# Patient Record
Sex: Male | Born: 1965 | Race: White | Hispanic: No | Marital: Married | Smoking: Current every day smoker
Health system: Southern US, Community
[De-identification: ages and names within clinical notes are randomized; demographics above are authoritative.]

## PROBLEM LIST (undated history)

## (undated) DIAGNOSIS — G600 Hereditary motor and sensory neuropathy: Secondary | ICD-10-CM

## (undated) DIAGNOSIS — F1011 Alcohol abuse, in remission: Secondary | ICD-10-CM

## (undated) DIAGNOSIS — D334 Benign neoplasm of spinal cord: Secondary | ICD-10-CM

## (undated) DIAGNOSIS — E785 Hyperlipidemia, unspecified: Secondary | ICD-10-CM

## (undated) DIAGNOSIS — F17200 Nicotine dependence, unspecified, uncomplicated: Secondary | ICD-10-CM

## (undated) HISTORY — DX: Hereditary motor and sensory neuropathy: G60.0

## (undated) HISTORY — DX: Benign neoplasm of spinal cord: D33.4

## (undated) HISTORY — PX: TUMOR REMOVAL: SHX12

## (undated) HISTORY — DX: Alcohol abuse, in remission: F10.11

## (undated) HISTORY — DX: Hyperlipidemia, unspecified: E78.5

## (undated) HISTORY — DX: Nicotine dependence, unspecified, uncomplicated: F17.200

---

## 1999-10-16 DIAGNOSIS — G600 Hereditary motor and sensory neuropathy: Secondary | ICD-10-CM

## 1999-10-16 HISTORY — DX: Hereditary motor and sensory neuropathy: G60.0

## 2004-01-12 ENCOUNTER — Other Ambulatory Visit: Payer: Self-pay

## 2005-04-20 ENCOUNTER — Other Ambulatory Visit: Payer: Self-pay

## 2005-04-20 ENCOUNTER — Emergency Department: Payer: Self-pay | Admitting: Unknown Physician Specialty

## 2005-04-26 ENCOUNTER — Ambulatory Visit: Payer: Self-pay | Admitting: Family Medicine

## 2005-05-15 ENCOUNTER — Ambulatory Visit: Payer: Self-pay | Admitting: Family Medicine

## 2005-06-15 ENCOUNTER — Ambulatory Visit: Payer: Self-pay | Admitting: Family Medicine

## 2005-07-15 ENCOUNTER — Ambulatory Visit: Payer: Self-pay | Admitting: Family Medicine

## 2005-09-29 ENCOUNTER — Emergency Department: Payer: Self-pay | Admitting: Emergency Medicine

## 2006-04-10 ENCOUNTER — Emergency Department: Payer: Self-pay | Admitting: Emergency Medicine

## 2006-04-11 ENCOUNTER — Ambulatory Visit: Payer: Self-pay | Admitting: Internal Medicine

## 2006-10-14 ENCOUNTER — Emergency Department: Payer: Self-pay | Admitting: Emergency Medicine

## 2007-05-06 ENCOUNTER — Emergency Department: Payer: Self-pay | Admitting: Emergency Medicine

## 2008-09-04 ENCOUNTER — Emergency Department: Payer: Self-pay | Admitting: Internal Medicine

## 2008-11-30 ENCOUNTER — Ambulatory Visit: Payer: Self-pay | Admitting: Urology

## 2009-06-06 ENCOUNTER — Emergency Department: Payer: Self-pay | Admitting: Unknown Physician Specialty

## 2009-06-06 ENCOUNTER — Encounter: Payer: Self-pay | Admitting: Cardiology

## 2009-06-09 ENCOUNTER — Ambulatory Visit: Payer: Self-pay | Admitting: Cardiology

## 2009-06-09 ENCOUNTER — Encounter (INDEPENDENT_AMBULATORY_CARE_PROVIDER_SITE_OTHER): Payer: Self-pay | Admitting: *Deleted

## 2009-06-09 DIAGNOSIS — R079 Chest pain, unspecified: Secondary | ICD-10-CM

## 2009-06-09 DIAGNOSIS — I319 Disease of pericardium, unspecified: Secondary | ICD-10-CM | POA: Insufficient documentation

## 2009-06-13 ENCOUNTER — Telehealth (INDEPENDENT_AMBULATORY_CARE_PROVIDER_SITE_OTHER): Payer: Self-pay | Admitting: *Deleted

## 2009-06-14 ENCOUNTER — Ambulatory Visit: Payer: Self-pay

## 2009-06-14 ENCOUNTER — Encounter: Payer: Self-pay | Admitting: Cardiovascular Disease

## 2009-06-14 ENCOUNTER — Encounter: Payer: Self-pay | Admitting: Cardiology

## 2009-12-26 ENCOUNTER — Ambulatory Visit: Payer: Self-pay | Admitting: Urology

## 2010-09-14 ENCOUNTER — Ambulatory Visit: Payer: Self-pay | Admitting: Family Medicine

## 2011-05-01 ENCOUNTER — Encounter: Payer: Self-pay | Admitting: Cardiology

## 2012-02-18 ENCOUNTER — Emergency Department: Payer: Self-pay | Admitting: *Deleted

## 2012-05-23 ENCOUNTER — Emergency Department: Payer: Self-pay | Admitting: *Deleted

## 2012-05-23 LAB — CBC WITH DIFFERENTIAL/PLATELET
Basophil %: 0.4 %
Eosinophil %: 0.9 %
HCT: 41.6 % (ref 40.0–52.0)
HGB: 14.2 g/dL (ref 13.0–18.0)
Lymphocyte #: 1 10*3/uL (ref 1.0–3.6)
Lymphocyte %: 17 %
MCHC: 34 g/dL (ref 32.0–36.0)
Monocyte %: 9.2 %
Neutrophil %: 72.5 %
RBC: 4.66 10*6/uL (ref 4.40–5.90)
WBC: 5.9 10*3/uL (ref 3.8–10.6)

## 2012-05-23 LAB — COMPREHENSIVE METABOLIC PANEL
Alkaline Phosphatase: 103 U/L (ref 50–136)
Anion Gap: 7 (ref 7–16)
BUN: 7 mg/dL (ref 7–18)
Bilirubin,Total: 0.5 mg/dL (ref 0.2–1.0)
Co2: 25 mmol/L (ref 21–32)
Creatinine: 0.62 mg/dL (ref 0.60–1.30)
Glucose: 346 mg/dL — ABNORMAL HIGH (ref 65–99)
SGPT (ALT): 19 U/L (ref 12–78)
Total Protein: 7.3 g/dL (ref 6.4–8.2)

## 2012-05-23 LAB — LIPASE, BLOOD: Lipase: 252 U/L (ref 73–393)

## 2012-07-01 ENCOUNTER — Emergency Department: Payer: Self-pay | Admitting: Emergency Medicine

## 2013-01-06 ENCOUNTER — Emergency Department: Payer: Self-pay | Admitting: Emergency Medicine

## 2013-03-25 ENCOUNTER — Emergency Department: Payer: Self-pay

## 2013-10-13 ENCOUNTER — Inpatient Hospital Stay: Payer: Self-pay | Admitting: Psychiatry

## 2013-10-13 LAB — CBC
HCT: 45 % (ref 40.0–52.0)
MCH: 31.3 pg (ref 26.0–34.0)
MCHC: 34 g/dL (ref 32.0–36.0)
MCV: 92 fL (ref 80–100)
Platelet: 207 10*3/uL (ref 150–440)
RBC: 4.89 10*6/uL (ref 4.40–5.90)
RDW: 13.5 % (ref 11.5–14.5)
WBC: 6.6 10*3/uL (ref 3.8–10.6)

## 2013-10-13 LAB — COMPREHENSIVE METABOLIC PANEL
Anion Gap: 11 (ref 7–16)
Calcium, Total: 9.1 mg/dL (ref 8.5–10.1)
Chloride: 95 mmol/L — ABNORMAL LOW (ref 98–107)
Co2: 22 mmol/L (ref 21–32)
Creatinine: 0.67 mg/dL (ref 0.60–1.30)
EGFR (African American): >60
EGFR (Non-African Amer.): >60
Glucose: 414 mg/dL — ABNORMAL HIGH (ref 65–99)
Osmolality: 274 (ref 275–301)
Potassium: 3.5 mmol/L (ref 3.5–5.1)
SGOT(AST): 31 U/L (ref 15–37)
SGPT (ALT): 38 U/L (ref 12–78)
Sodium: 128 mmol/L — ABNORMAL LOW (ref 136–145)
Total Protein: 7.7 g/dL (ref 6.4–8.2)

## 2013-10-13 LAB — URINALYSIS, COMPLETE
Bacteria: NONE SEEN
Blood: NEGATIVE
Leukocyte Esterase: NEGATIVE
RBC,UR: <1 /HPF (ref 0–5)
Specific Gravity: 1.003 (ref 1.003–1.030)
WBC UR: NONE SEEN /HPF (ref 0–5)

## 2013-10-13 LAB — DRUG SCREEN, URINE
Amphetamines, Ur Screen: NEGATIVE (ref ?–1000)
Barbiturates, Ur Screen: NEGATIVE (ref ?–200)
Benzodiazepine, Ur Scrn: NEGATIVE (ref ?–200)
MDMA (Ecstasy)Ur Screen: NEGATIVE (ref ?–500)
Methadone, Ur Screen: NEGATIVE (ref ?–300)
Opiate, Ur Screen: NEGATIVE (ref ?–300)
Phencyclidine (PCP) Ur S: NEGATIVE (ref ?–25)

## 2013-10-13 LAB — ETHANOL: Ethanol: 53 mg/dL

## 2014-04-10 ENCOUNTER — Emergency Department: Payer: Self-pay | Admitting: Emergency Medicine

## 2014-04-10 LAB — CBC WITH DIFFERENTIAL/PLATELET
BASOS ABS: 0 10*3/uL (ref 0.0–0.1)
Basophil %: 0.6 %
EOS PCT: 1.6 %
Eosinophil #: 0.1 10*3/uL (ref 0.0–0.7)
HCT: 44 % (ref 40.0–52.0)
HGB: 14.6 g/dL (ref 13.0–18.0)
LYMPHS PCT: 19.8 %
Lymphocyte #: 1.4 10*3/uL (ref 1.0–3.6)
MCH: 31.1 pg (ref 26.0–34.0)
MCHC: 33.2 g/dL (ref 32.0–36.0)
MCV: 94 fL (ref 80–100)
Monocyte #: 0.8 x10 3/mm (ref 0.2–1.0)
Monocyte %: 11.5 %
Neutrophil #: 4.6 10*3/uL (ref 1.4–6.5)
Neutrophil %: 66.5 %
PLATELETS: 204 10*3/uL (ref 150–440)
RBC: 4.71 10*6/uL (ref 4.40–5.90)
RDW: 12.9 % (ref 11.5–14.5)
WBC: 6.9 10*3/uL (ref 3.8–10.6)

## 2014-04-10 LAB — COMPREHENSIVE METABOLIC PANEL
ALBUMIN: 4.2 g/dL (ref 3.4–5.0)
ALK PHOS: 66 U/L
ANION GAP: 8 (ref 7–16)
BILIRUBIN TOTAL: 0.5 mg/dL (ref 0.2–1.0)
BUN: 12 mg/dL (ref 7–18)
CO2: 24 mmol/L (ref 21–32)
CREATININE: 0.81 mg/dL (ref 0.60–1.30)
Calcium, Total: 9.1 mg/dL (ref 8.5–10.1)
Chloride: 106 mmol/L (ref 98–107)
Glucose: 92 mg/dL (ref 65–99)
OSMOLALITY: 275 (ref 275–301)
POTASSIUM: 3.9 mmol/L (ref 3.5–5.1)
SGOT(AST): 24 U/L (ref 15–37)
SGPT (ALT): 26 U/L (ref 12–78)
Sodium: 138 mmol/L (ref 136–145)
TOTAL PROTEIN: 7.7 g/dL (ref 6.4–8.2)

## 2014-04-10 LAB — CK: CK, Total: 100 U/L

## 2014-04-10 LAB — TROPONIN I: Troponin-I: <0.02 ng/mL

## 2014-04-11 LAB — URINALYSIS, COMPLETE
Bacteria: NONE SEEN
Bilirubin,UR: NEGATIVE
Blood: NEGATIVE
GLUCOSE, UR: NEGATIVE mg/dL (ref 0–75)
Ketone: NEGATIVE
Leukocyte Esterase: NEGATIVE
Nitrite: NEGATIVE
Ph: 5 (ref 4.5–8.0)
Protein: NEGATIVE
RBC,UR: 1 /HPF (ref 0–5)
SQUAMOUS EPITHELIAL: NONE SEEN
Specific Gravity: 1.011 (ref 1.003–1.030)

## 2014-05-17 ENCOUNTER — Emergency Department: Payer: Self-pay | Admitting: Emergency Medicine

## 2014-05-18 LAB — COMPREHENSIVE METABOLIC PANEL
AST: 11 U/L — AB (ref 15–37)
Albumin: 4 g/dL (ref 3.4–5.0)
Alkaline Phosphatase: 52 U/L
Anion Gap: 8 (ref 7–16)
BUN: 17 mg/dL (ref 7–18)
Bilirubin,Total: 0.4 mg/dL (ref 0.2–1.0)
CALCIUM: 8.6 mg/dL (ref 8.5–10.1)
CREATININE: 0.64 mg/dL (ref 0.60–1.30)
Chloride: 105 mmol/L (ref 98–107)
Co2: 26 mmol/L (ref 21–32)
EGFR (African American): >60
GLUCOSE: 134 mg/dL — AB (ref 65–99)
OSMOLALITY: 281 (ref 275–301)
Potassium: 4.2 mmol/L (ref 3.5–5.1)
SGPT (ALT): 21 U/L
Sodium: 139 mmol/L (ref 136–145)
TOTAL PROTEIN: 7.4 g/dL (ref 6.4–8.2)

## 2014-05-18 LAB — TROPONIN I: Troponin-I: <0.02 ng/mL

## 2014-05-18 LAB — CBC WITH DIFFERENTIAL/PLATELET
BASOS PCT: 0.6 %
Basophil #: 0 10*3/uL (ref 0.0–0.1)
Eosinophil #: 0.1 10*3/uL (ref 0.0–0.7)
Eosinophil %: 1.7 %
HCT: 42.4 % (ref 40.0–52.0)
HGB: 14.4 g/dL (ref 13.0–18.0)
LYMPHS ABS: 1.1 10*3/uL (ref 1.0–3.6)
LYMPHS PCT: 16.5 %
MCH: 32 pg (ref 26.0–34.0)
MCHC: 33.9 g/dL (ref 32.0–36.0)
MCV: 94 fL (ref 80–100)
MONOS PCT: 8.7 %
Monocyte #: 0.6 x10 3/mm (ref 0.2–1.0)
Neutrophil #: 4.7 10*3/uL (ref 1.4–6.5)
Neutrophil %: 72.5 %
Platelet: 201 10*3/uL (ref 150–440)
RBC: 4.49 10*6/uL (ref 4.40–5.90)
RDW: 13 % (ref 11.5–14.5)
WBC: 6.5 10*3/uL (ref 3.8–10.6)

## 2014-05-18 LAB — LIPASE, BLOOD: Lipase: 165 U/L (ref 73–393)

## 2014-06-28 ENCOUNTER — Ambulatory Visit: Payer: Self-pay | Admitting: Surgery

## 2014-07-05 ENCOUNTER — Other Ambulatory Visit: Payer: Self-pay | Admitting: Neurology

## 2014-07-05 DIAGNOSIS — R29898 Other symptoms and signs involving the musculoskeletal system: Secondary | ICD-10-CM

## 2014-07-06 ENCOUNTER — Other Ambulatory Visit: Payer: Self-pay | Admitting: Neurology

## 2014-07-06 DIAGNOSIS — R29898 Other symptoms and signs involving the musculoskeletal system: Secondary | ICD-10-CM

## 2014-07-06 DIAGNOSIS — N289 Disorder of kidney and ureter, unspecified: Secondary | ICD-10-CM

## 2014-07-07 IMAGING — CR DG CHEST 2V
1 series · 3 of 3 positions shown · non-contrast
Comparison: none

REASON FOR EXAM: fall injury
COMMENTS:

[Series 1: pa · 0.17mm/px · 3 of 3 slices shown]
[im 1/3]
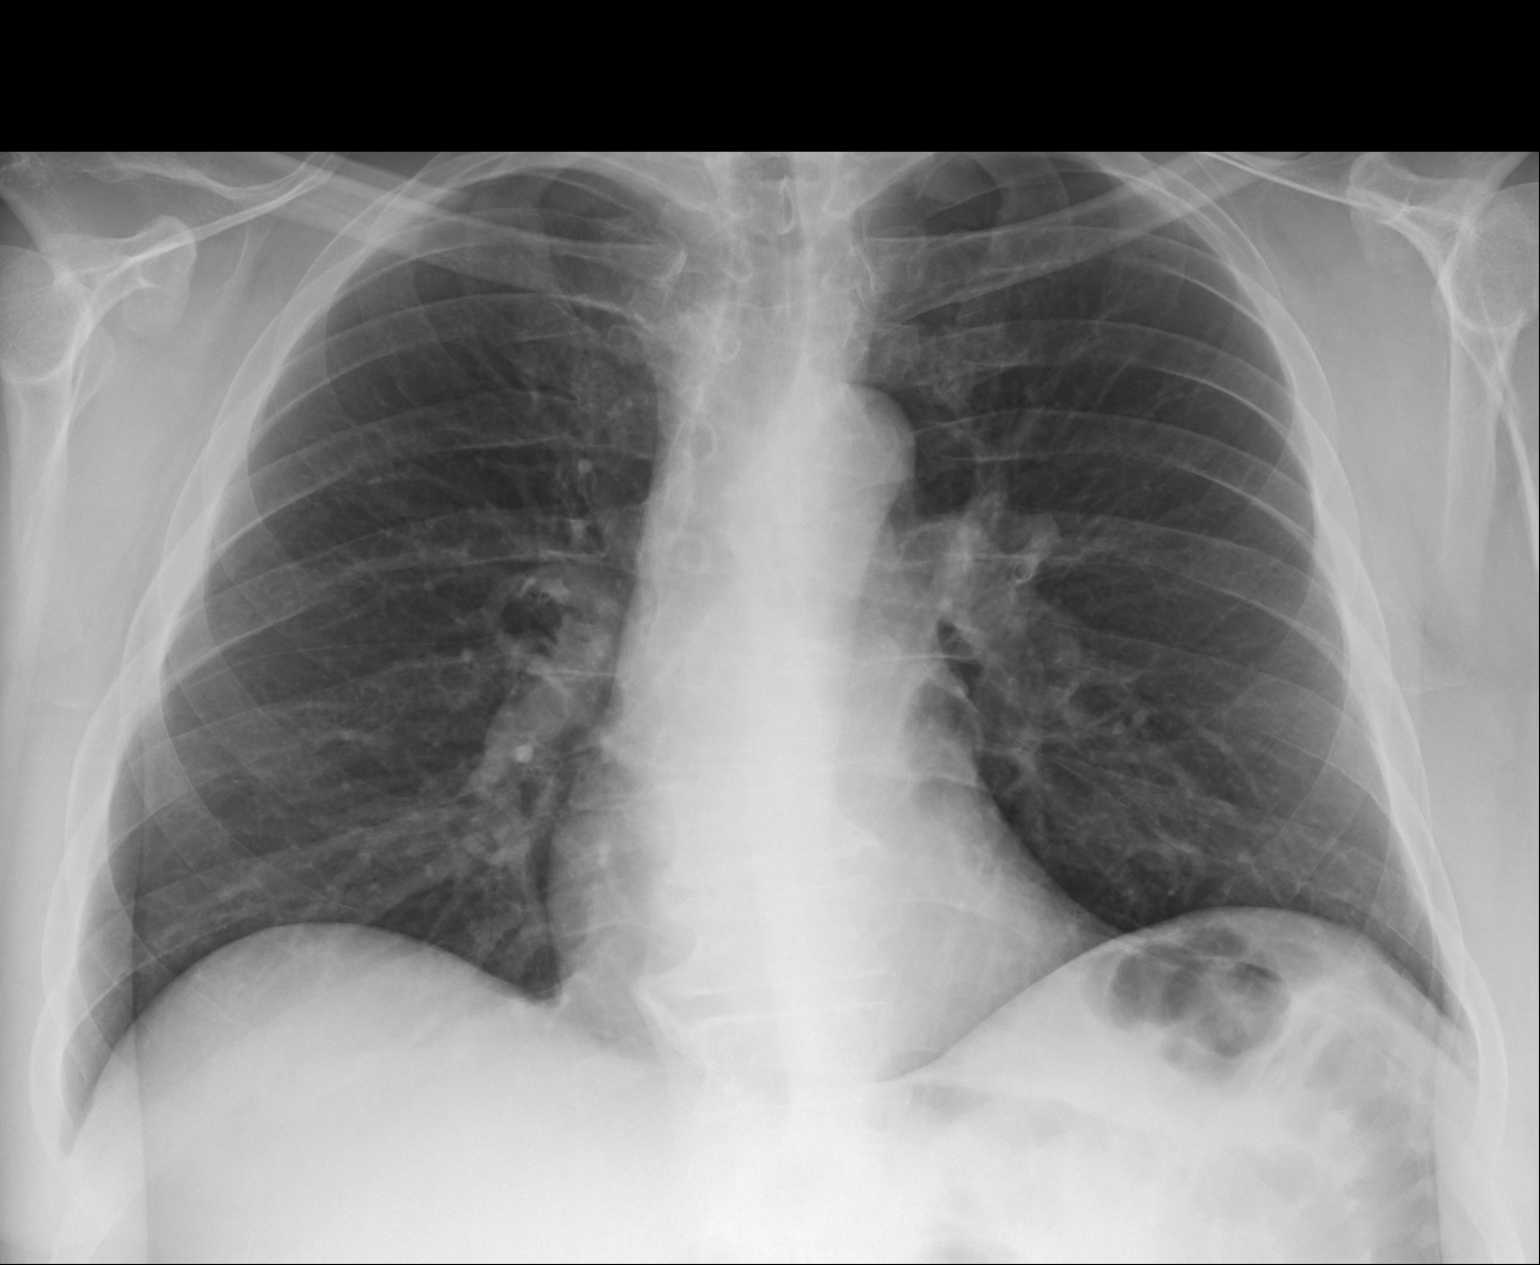
[im 2/3]
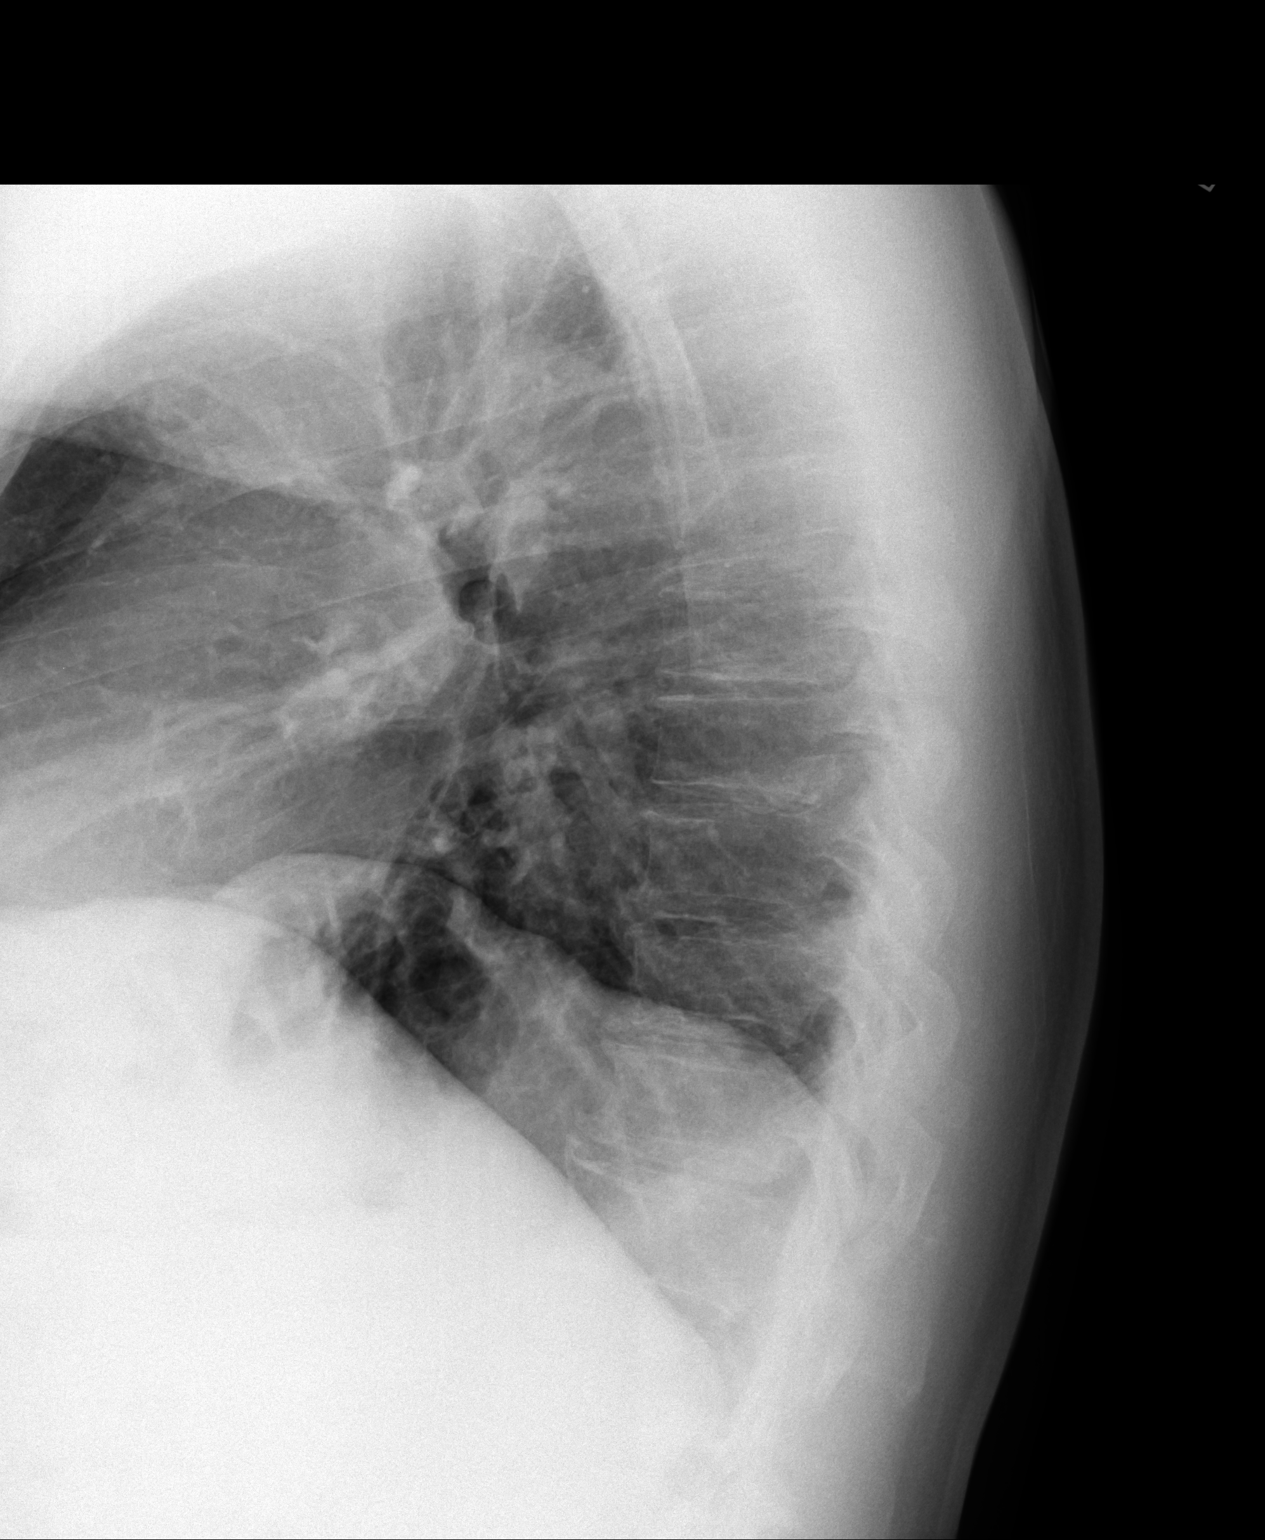
[im 3/3]
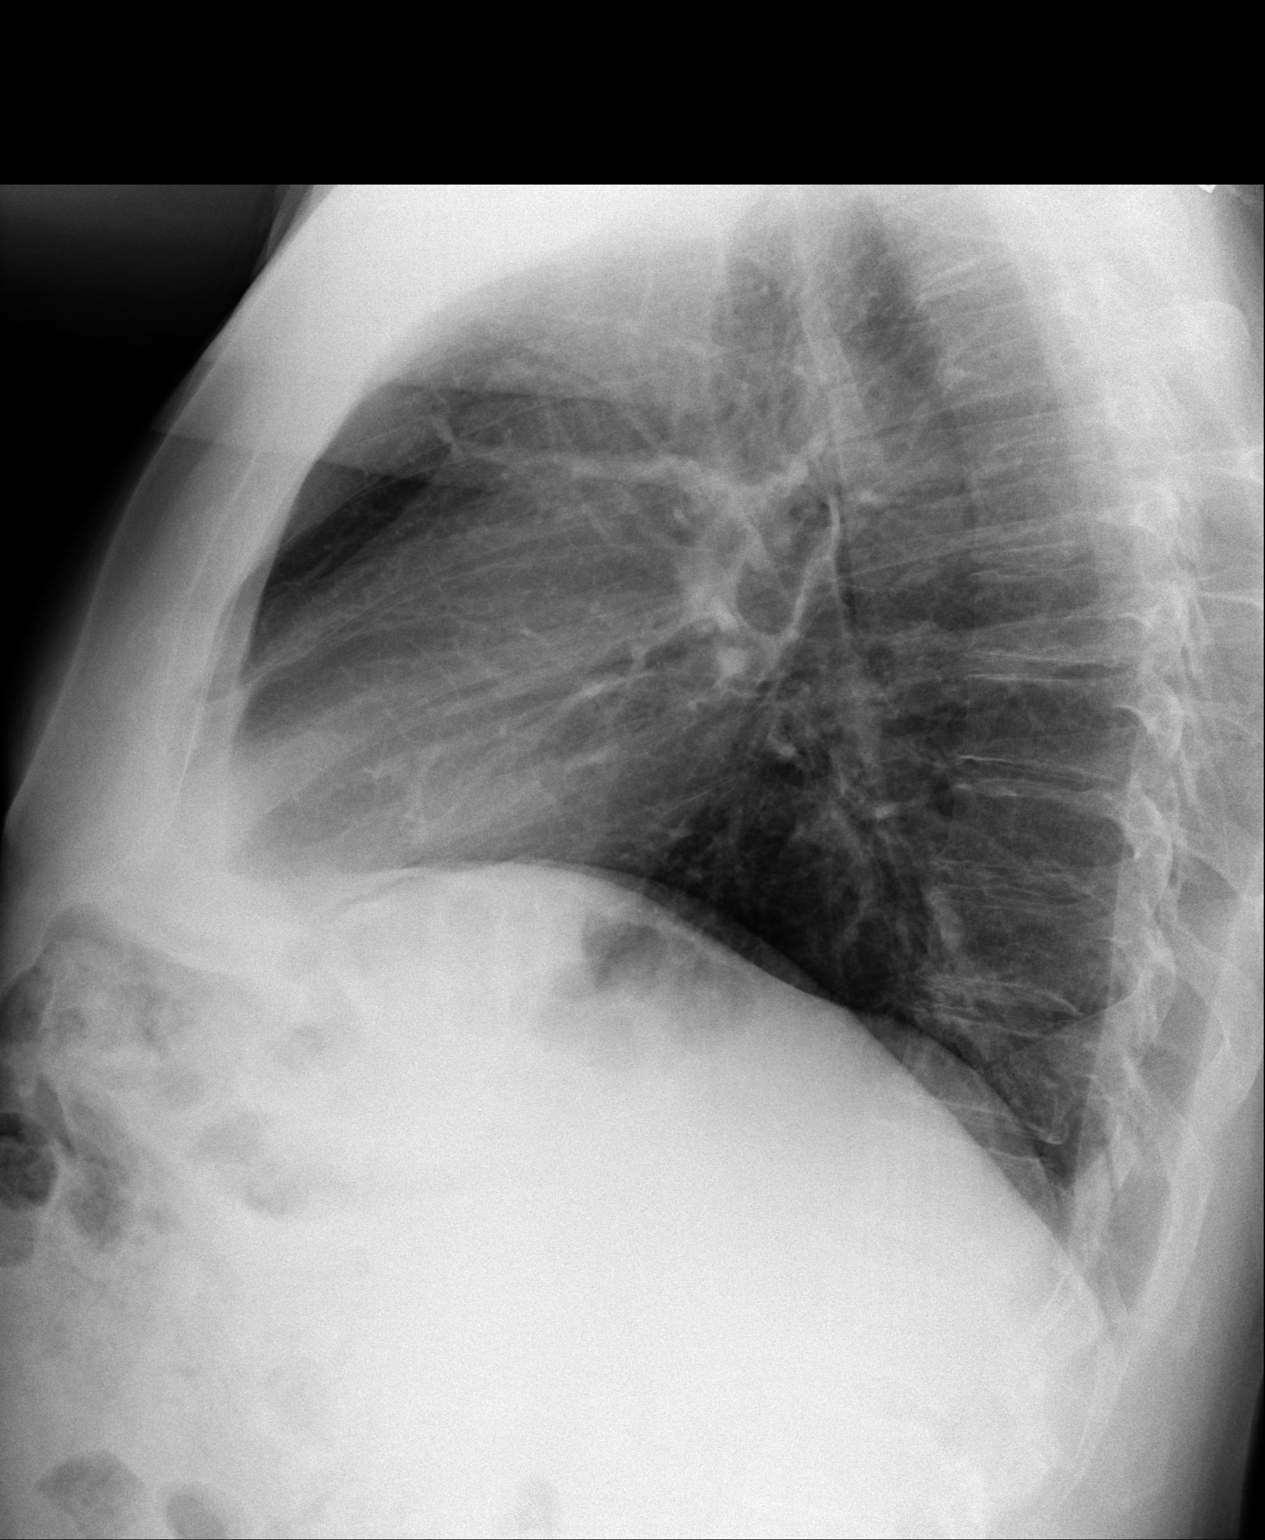

[3 of 3 positions shown; findings below may reference images not displayed]

PROCEDURE:     DXR - DXR CHEST PA (OR AP) AND LATERAL  - May 23, 2012  [DATE]

RESULT:     Comparison is made to the previous examination dated 06 June, 2009.

The lungs are clear. The heart and pulmonary vessels are normal. The bony
and mediastinal structures are unremarkable. There is no effusion. There is
no pneumothorax or evidence of congestive failure.
IMPRESSION: No acute cardiopulmonary disease.

[REDACTED]

## 2014-07-16 ENCOUNTER — Other Ambulatory Visit: Payer: Self-pay

## 2014-07-20 ENCOUNTER — Other Ambulatory Visit: Payer: Self-pay

## 2014-07-26 ENCOUNTER — Ambulatory Visit
Admission: RE | Admit: 2014-07-26 | Discharge: 2014-07-26 | Disposition: A | Discharge status: Home / Self Care | Payer: No Typology Code available for payment source | Source: Ambulatory Visit | Attending: Neurology | Admitting: Neurology

## 2014-07-26 DIAGNOSIS — N289 Disorder of kidney and ureter, unspecified: Secondary | ICD-10-CM

## 2014-07-26 DIAGNOSIS — R29898 Other symptoms and signs involving the musculoskeletal system: Secondary | ICD-10-CM

## 2014-07-26 MED ORDER — GADOBENATE DIMEGLUMINE 529 MG/ML IV SOLN
18.0000 mL | Freq: Once | INTRAVENOUS | Status: AC | PRN
  Administered 2014-07-26: 18 mL via INTRAVENOUS

## 2014-08-03 ENCOUNTER — Other Ambulatory Visit: Payer: Self-pay

## 2014-08-05 ENCOUNTER — Other Ambulatory Visit: Payer: Self-pay

## 2014-08-06 ENCOUNTER — Other Ambulatory Visit: Payer: Self-pay

## 2014-08-10 ENCOUNTER — Ambulatory Visit
Admission: RE | Admit: 2014-08-10 | Discharge: 2014-08-10 | Disposition: A | Discharge status: Home / Self Care | Payer: Self-pay | Source: Ambulatory Visit | Attending: Neurology | Admitting: Neurology

## 2014-08-10 ENCOUNTER — Ambulatory Visit
Admission: RE | Admit: 2014-08-10 | Discharge: 2014-08-10 | Disposition: A | Discharge status: Home / Self Care | Payer: No Typology Code available for payment source | Source: Ambulatory Visit | Attending: Neurology | Admitting: Neurology

## 2014-08-10 DIAGNOSIS — R29898 Other symptoms and signs involving the musculoskeletal system: Secondary | ICD-10-CM

## 2014-08-10 MED ORDER — GADOBENATE DIMEGLUMINE 529 MG/ML IV SOLN
20.0000 mL | Freq: Once | INTRAVENOUS | Status: AC | PRN
  Administered 2014-08-10: 20 mL via INTRAVENOUS

## 2014-08-25 ENCOUNTER — Emergency Department: Payer: Self-pay | Admitting: Emergency Medicine

## 2014-08-25 LAB — CBC
HCT: 40.5 % (ref 40.0–52.0)
HGB: 13.9 g/dL (ref 13.0–18.0)
MCH: 31.3 pg (ref 26.0–34.0)
MCHC: 34.4 g/dL (ref 32.0–36.0)
MCV: 91 fL (ref 80–100)
Platelet: 235 10*3/uL (ref 150–440)
RBC: 4.45 10*6/uL (ref 4.40–5.90)
RDW: 12.9 % (ref 11.5–14.5)
WBC: 7.4 10*3/uL (ref 3.8–10.6)

## 2014-08-25 LAB — BASIC METABOLIC PANEL
Anion Gap: 11 (ref 7–16)
BUN: 15 mg/dL (ref 7–18)
Calcium, Total: 9.3 mg/dL (ref 8.5–10.1)
Chloride: 100 mmol/L (ref 98–107)
Co2: 25 mmol/L (ref 21–32)
Creatinine: 0.72 mg/dL (ref 0.60–1.30)
Glucose: 108 mg/dL — ABNORMAL HIGH (ref 65–99)
OSMOLALITY: 273 (ref 275–301)
POTASSIUM: 3.9 mmol/L (ref 3.5–5.1)
Sodium: 136 mmol/L (ref 136–145)

## 2015-01-26 ENCOUNTER — Other Ambulatory Visit: Payer: Self-pay | Admitting: Neurosurgery

## 2015-01-26 DIAGNOSIS — D492 Neoplasm of unspecified behavior of bone, soft tissue, and skin: Secondary | ICD-10-CM
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
## 2015-02-05 NOTE — Consult Note (Signed)
PATIENT NAME:  Jay Thomas, Jay Thomas MR#:  [id number] DATE OF BIRTH:  Sep 04, 1966  DATE OF CONSULTATION:  10/15/2013  REFERRING PHYSICIAN:  Dr. Weber Cooks.  CONSULTING PHYSICIAN:  Lashawnna Lambrecht G. Anselm Jungling, MD  REASON FOR CONSULTATION:  Diabetes management.   HISTORY OF PRESENT ILLNESS:  This is a 49 year old male who has history of muscular dystrophy since 2000 and diagnosed with diabetes the same year in 2000 and was on oral diabetic medication since 2003 and noticed almost one year ago of having uncontrolled diabetes with oral medications and so added long-acting insulin every night. He is following with Dr. Gabriel Carina in the clinic, and he states that after the last change was done in his diabetes regimen, which was Actos 30 mg every morning and Januvia 100 mg every morning plus metformin 1000 mg every night plus long-acting insulin 22 units every night. His sugar was remaining fairly under control in the range of 110 to 120 when he was checking almost every day for the last one year. 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 Due to some social issues at his home the last 2 to 3 weeks, his wife, who is a Marine scientist by profession in Dr. Joycie Peek office, was giving his insulin injections before but now he is going through some stressful situation with her, and so she stopped giving insulin injections to him and stopped taking care of his diabetes. Because of his muscular dystrophic and having deformed fingers and hands, he had difficulty taking insulin injections by pen, and setting it up and taking the injection on his deltoids. He said that he can manage to take it on the thighs but not on the deltoids on his own, and so for the last 2 to 3 weeks because of the stress, he even did not take his oral meds properly and he did not watch his diet and did not take any insulin  injections, so his sugar was uncontrolled. Finally because of his mental disturbance, he decided to come to the Emergency Room and get himself admitted for treatment of depression and behavior issues and so he is admitted under behavior service. His sugar was noticed to be high in the range of 300 to 400 when they started him on his home regimen and medical consult is called in for further help with blood sugar management. On further questioning, the patient confirms that then he was taking this home regimen regularly and last summer pills, a few weeks ago, his sugar was remaining fairly under control.   REVIEW OF SYSTEMS:  CONSTITUTIONAL:  Negative for fever, fatigue, weakness, pain or weight loss.  EYES:  No blurring, double vision, discharge or redness.  EARS, NOSE, THROAT:  No tinnitus, ear pain or hearing loss.  RESPIRATORY:  No cough, wheezing, hemoptysis, or shortness of breath.  CARDIOVASCULAR:  No chest pain, orthopnea, edema, palpitations.  GASTROINTESTINAL:  No nausea, vomiting, diarrhea, abdominal pain.  GENITOURINARY:  No dysuria, hematuria or increased frequency.  ENDOCRINE:  No increased sweating. No heat or cold intolerance.  SKIN:  No acne, rashes, or lesions.  MUSCULOSKELETAL:  No pain or swelling in the joints. There is some chronic deformities in his hands and weakness in the legs. Because of that he has to use braces on both his legs.   PAST MEDICAL HISTORY:  Diabetes, hyperlipidemia and depression.  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   SOCIAL HISTORY:  Lives at home with his family, but currently there is some stressful home situations. He is a smoker, smoking 1/2 pack a day for almost 20 years now. He was an alcoholic 7 years ago and he stopped drinking, was sober for almost 6 years but then because of some disturbance started drinking every now and  then, but for the last 90 days, he said that he did not drink alcohol. Denies doing any illegal drug use.   FAMILY HISTORY:  Positive in multiple family members for diabetes, coronary artery disease, strokes and cancer.   HOME MEDICATIONS: 1.  Simvastatin 40 mg once a day.  2.  Sertraline 100 mg once a day.  3.  Actos 30 mg once a day.  4.  Metformin 500 mg 2 tablets once a day.  5.  Januvia 100 mg once a day.  6.  Imodium 2 tablets every 4 hours.  7.  Long-acting insulin 22 units once a day every night.  PHYSICAL EXAMINATION: VITAL SIGNS:  Temperature 98.3, pulse 96, respirations 18, blood pressure 129/85.  GENERAL:  Fully alert and oriented to time, place and person. Does not appear in any acute distress.  HEENT:  Head and neck atraumatic. Conjunctiva pink. Oral mucosa moist.  NECK:  Supple. No JVD.  RESPIRATORY:  Bilateral clear and equal air entry.  CARDIOVASCULAR:  S1, S2 present, regular. No murmur.  ABDOMEN:  Soft, nontender. Bowel sounds present. No organomegaly.  SKIN:  No rashes.  LEGS:  No edema.  NEUROLOGICAL:  The patient has muscular dystrophy and some atrophied hands. He is able to use all 4 limbs, but there is muscle weakness and atrophies present. He is wearing braces on both lower limbs.  IMPORTANT LABORATORY RESULTS:  Glucose on presentation was 414 and then fingerstick ranging anywhere between 400 to 170, BUN 10, creatinine 0.67, sodium 128, potassium 3.5, chloride 95. Urine for toxicology was negative on presentation. WBC 6.6, hemoglobin 15.3, platelet count 207. Urinalysis was negative. Acetaminophen level less than 2.   ASSESSMENT AND PLAN:  A 49 year old male with muscular dystrophy, diabetes and depression admitted behavior care for depression issues.  1.  Uncontrolled diabetes. The patient accepts that for the last 2 to 3 weeks because of stressful events in his life, he is not taking any diabetic medications and not taking any insulin injections, not watching  his diet as well. Blood sugar level is high, ranging from 170 to 400. He is started back on his home regimen by Psychiatry doctors. With this oral medication, it might take still 2 to 3 days for having full effect and his blood sugar level to come under control so I will give it a time for the next 2 to 3 days to bring it under control. Meanwhile, I will increase his Levemir from 22 to 28, but at home, he was taking 22 and sugar was fairly under control so maybe after a few days, once the sugar comes under control, we might be able to cut it back down to 22. As of now, his wife was helping him to take insulin injections in the deltoid muscle by using insulin pen but now, because of stressful situation and possible divorce situation with the wife, he would be needed to take insulin injections on his own so might need some  diabetes teaching and how to use the pen with his dystrophic hands and take it on his thighs, if possible; otherwise may need some kind of arrangements by social worker in this issue at the time of discharge.  2.  Muscular dystrophy. This is chronic and stable issue.  3.  Hyperlipidemia. Continue simvastatin.  4.  Depression. Treat as per Psychiatry recommendations.   Thank you for allowing [place] to participate in this patient's care. We will continue following for better management of his blood sugar level.   TOTAL TIME SPENT IN THIS CONSULTATION:  50 minutes.  ____________________________ Ceasar Lund Anselm Jungling, MD vgv:jm D: 10/15/2013 14:29:48 ET T: 10/15/2013 15:32:39 ET JOB#: [id number]  cc: Ceasar Lund. Anselm Jungling, MD, <Dictator> Vaughan Basta MD ELECTRONICALLY SIGNED 10/21/2013 13:51

## 2015-02-05 NOTE — Consult Note (Signed)
Brief Consult Note: Diagnosis: uncontrolled DM.   Patient was seen by consultant.   Consult note dictated.   Orders entered.   Comments: pt did not took any meds for DM for last 2-3 weeks and did not watch his diet too. Now back on Home meds- may take 2-3 days to bring DM under control with same regimen- will like to eait as he said it was working fine for last few months. will increase long acting Insulin for now.- may cut back later if fair control.  Electronic Signatures: Vaughan Basta (MD)  (Signed 01-Jan-15 14:16)  Authored: Brief Consult Note   Last Updated: 01-Jan-15 14:16 by Vaughan Basta (MD)

## 2015-02-05 NOTE — Consult Note (Signed)
PATIENT NAME:  Jay Thomas, Jay Thomas MR#:  [id number] DATE OF BIRTH:  1966/09/27  DATE OF CONSULTATION:  10/15/2013  REFERRING PHYSICIAN:   CONSULTING PHYSICIAN:  Ceasar Lund. Anselm Jungling, MD  ADDENDUM to medical consult  ASSESSMENT AND PLAN:  Tobacco abuse, smoker. Counseling done for 4 minutes for smoking cessation. He does not want to even try that at this point because of his stressful life events, but he is fine without having any nicotine patch or inhalers in the hospital.   TOTAL TIME SPENT IN THIS COUNSELING:  4 minutes.   ____________________________ Ceasar Lund Anselm Jungling, MD vgv:jm D: 10/15/2013 15:06:40 ET T: 10/15/2013 16:28:08 ET JOB#: [id number]  cc: Ceasar Lund. Anselm Jungling, MD, <Dictator> Vaughan Basta MD ELECTRONICALLY SIGNED 10/21/2013 13:51

## 2015-02-18 ENCOUNTER — Ambulatory Visit
Admission: RE | Admit: 2015-02-18 | Discharge: 2015-02-18 | Disposition: A | Discharge status: Home / Self Care | Payer: PRIVATE HEALTH INSURANCE | Source: Ambulatory Visit | Attending: Neurosurgery | Admitting: Neurosurgery

## 2015-02-18 DIAGNOSIS — D492 Neoplasm of unspecified behavior of bone, soft tissue, and skin: Secondary | ICD-10-CM

## 2015-02-18 MED ORDER — GADOBENATE DIMEGLUMINE 529 MG/ML IV SOLN
20.0000 mL | Freq: Once | INTRAVENOUS | Status: AC | PRN
  Administered 2015-02-18: 20 mL via INTRAVENOUS

## 2021-02-12 DEATH — deceased
